# Patient Record
Sex: Male | Born: 1965 | Race: White | Hispanic: No | Marital: Married
Health system: Southern US, Community
[De-identification: ages and names within clinical notes are randomized; demographics above are authoritative.]

## PROBLEM LIST (undated history)

## (undated) DIAGNOSIS — L80 Vitiligo: Secondary | ICD-10-CM

---

## 1997-11-28 ENCOUNTER — Ambulatory Visit (HOSPITAL_COMMUNITY): Admission: RE | Admit: 1997-11-28 | Discharge: 1997-11-28 | Payer: Self-pay | Admitting: Gastroenterology

## 2004-12-04 ENCOUNTER — Encounter: Admission: RE | Admit: 2004-12-04 | Discharge: 2004-12-04 | Payer: Self-pay | Admitting: Family Medicine

## 2006-02-22 IMAGING — US US ABDOMEN COMPLETE
1 series · 14 of 25 positions shown · non-contrast
Comparison: none

CLINICAL DATA: Abnormal LFT?s. 
 ULTRASOUND ABDOMEN COMPLETE:
 Scans over the upper abdomen were performed.  The gallbladder is well seen and no gallstones are noted.  The liver has a normal echogenic pattern.  The common bile duct is normal measuring 5.0 mm in diameter.  IVC, pancreas and spleen appear normal.  No hydronephrosis is noted.  The right kidney measures 11.5 cm sagittally with the left kidney measuring 12.4 cm.  There is an apparent parapelvic cyst on the left of 1.5 cm.  The abdominal aorta is normal in caliber.

[Series 1: unknown · 0.26mm/px · 14 of 64 slices shown]
[im 1/64]
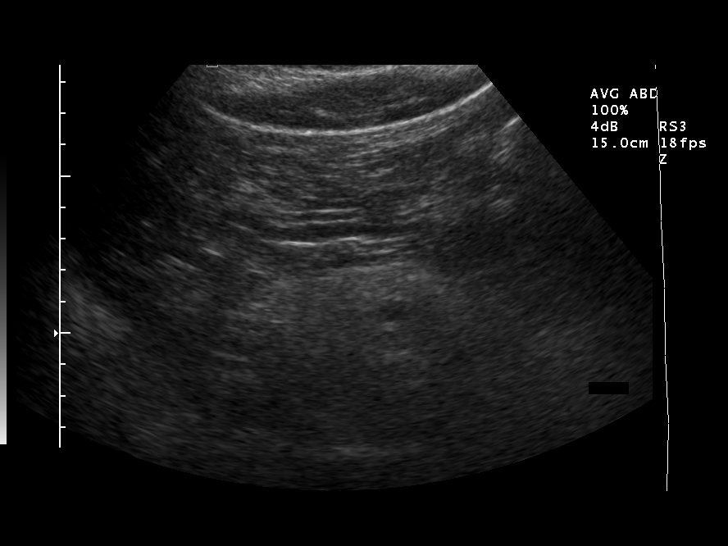
[im 6/64]
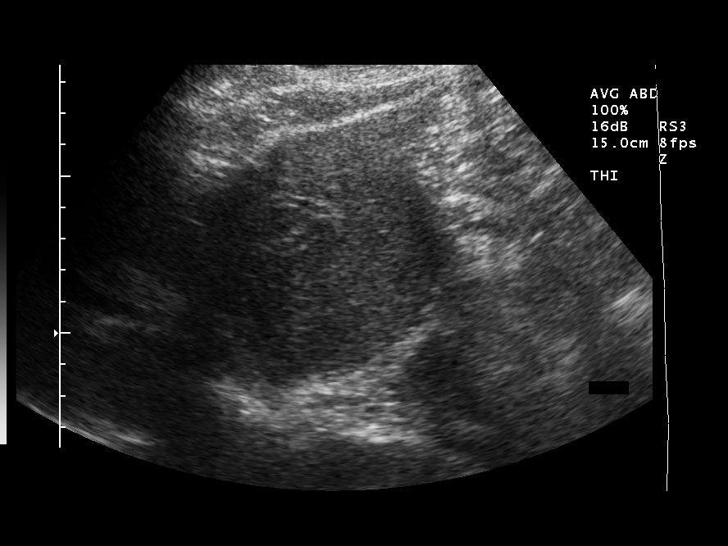
[im 11/64]
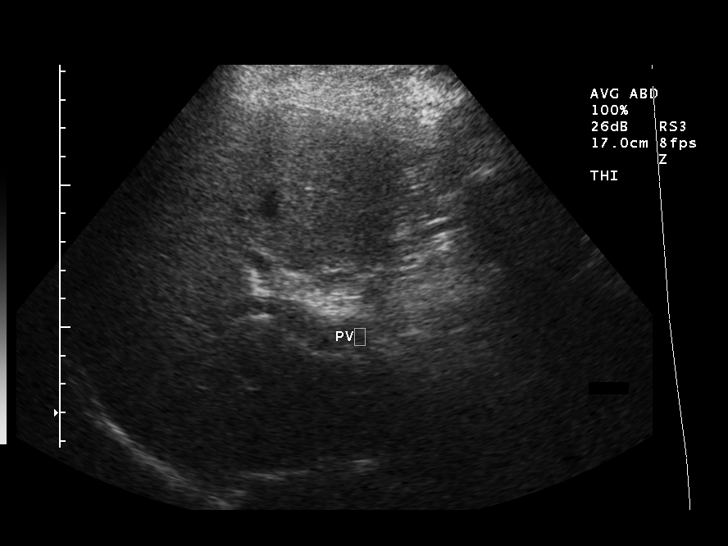
[im 16/64]
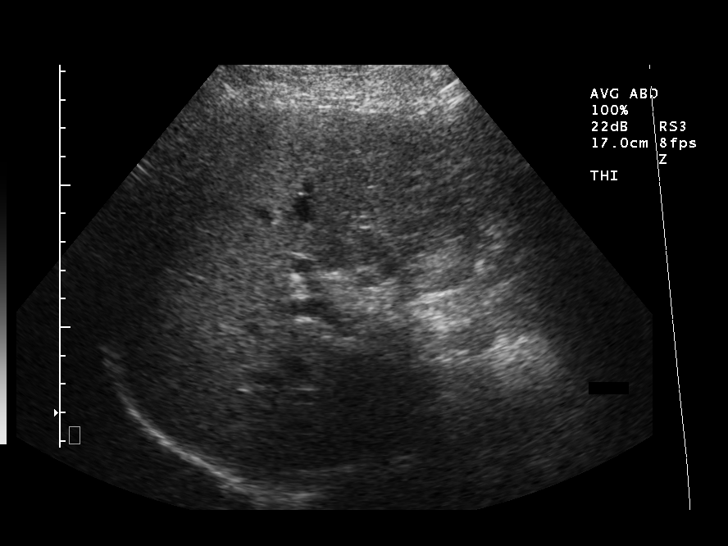
[im 22/64]
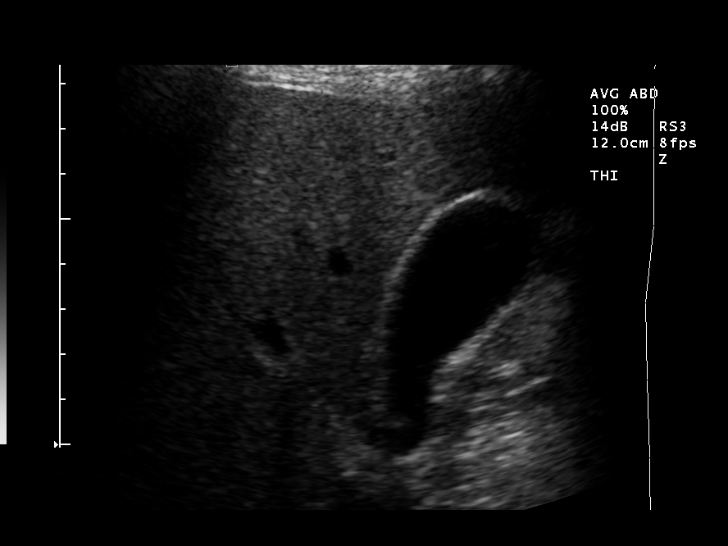
[im 24/64]
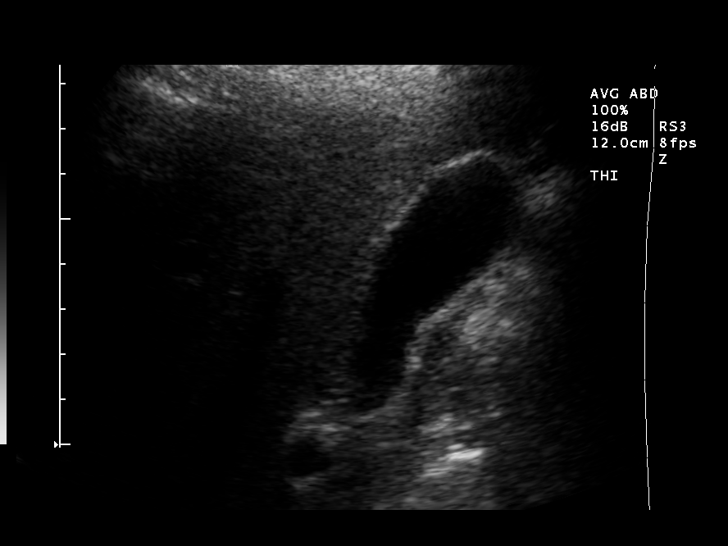
[im 29/64]
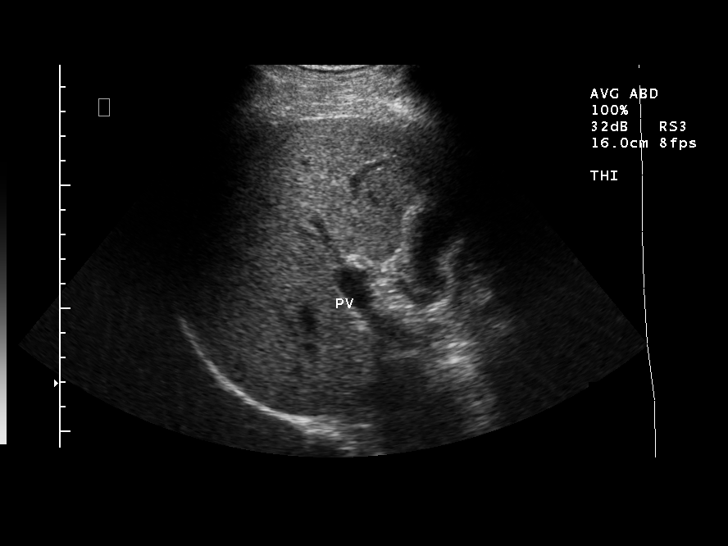
[im 35/64]
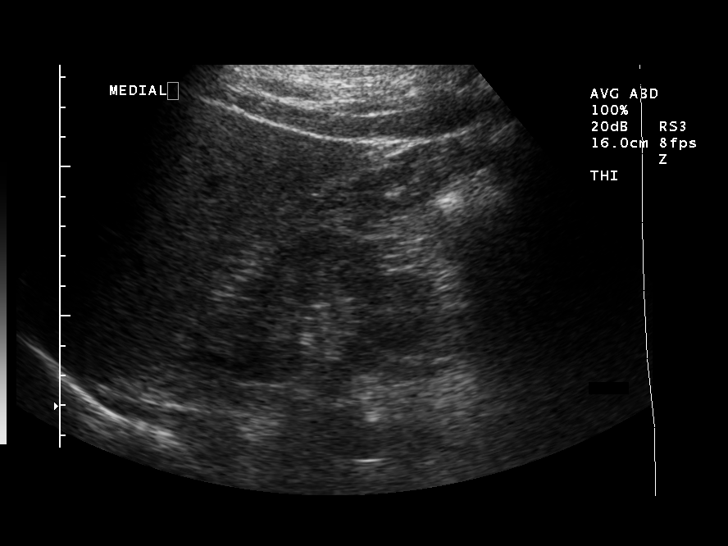
[im 40/64]
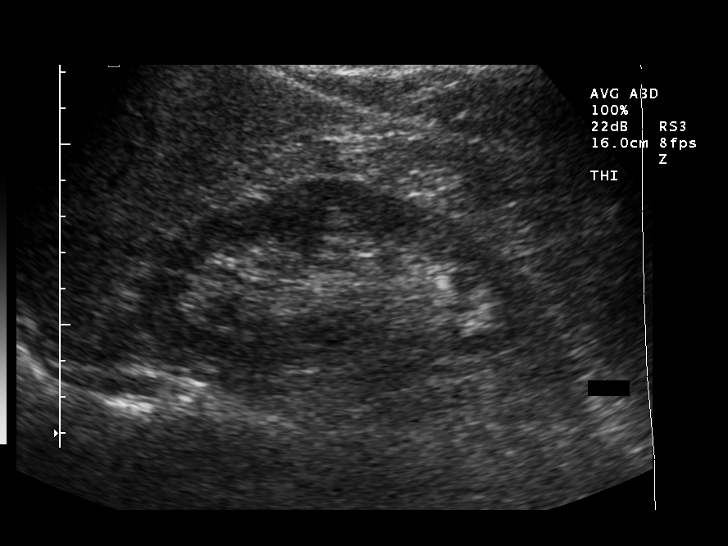
[im 43/64]
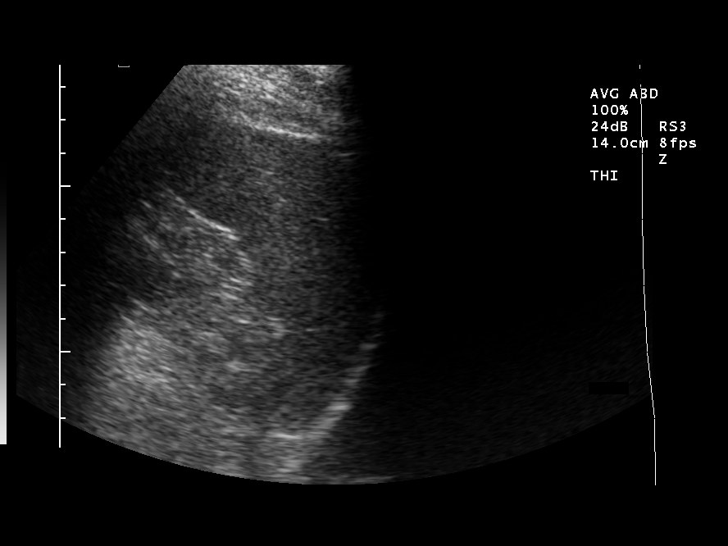
[im 48/64]
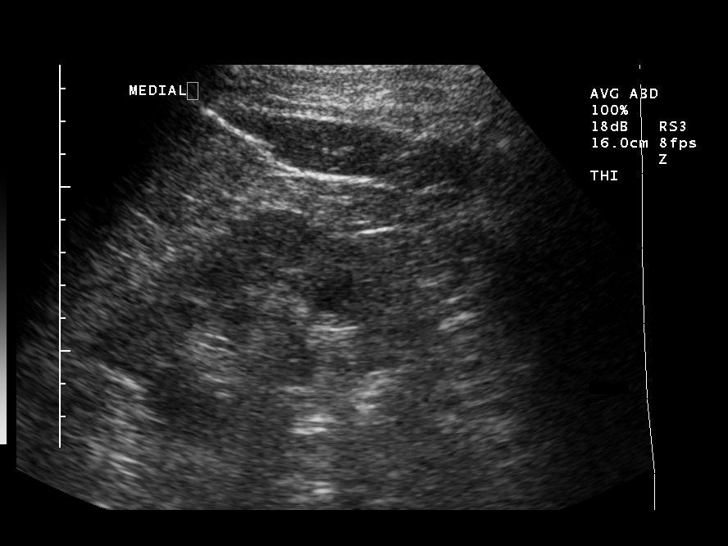
[im 53/64]
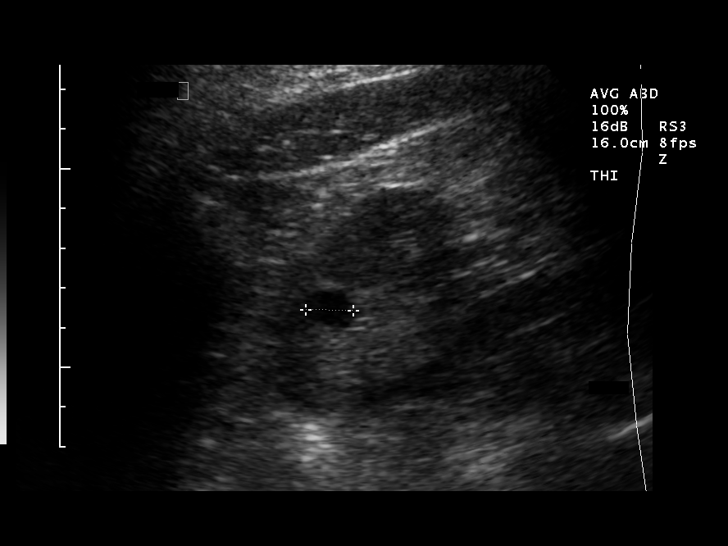
[im 58/64]
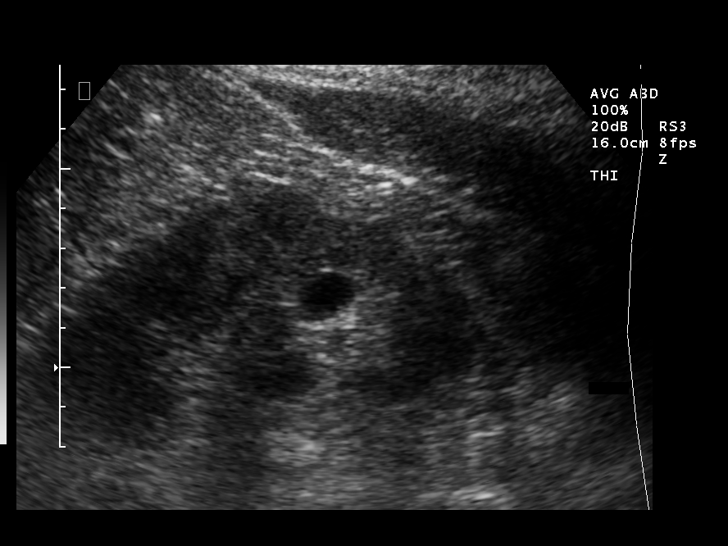
[im 64/64]
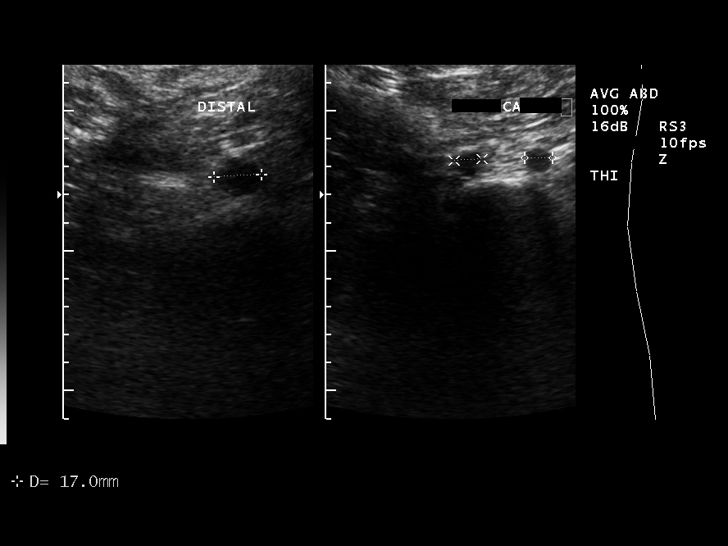

[14 of 25 positions shown; findings below may reference images not displayed]

IMPRESSION: Negative abdominal ultrasound.  No gallstones.

## 2018-05-24 ENCOUNTER — Other Ambulatory Visit: Payer: Self-pay

## 2018-05-24 ENCOUNTER — Encounter (HOSPITAL_BASED_OUTPATIENT_CLINIC_OR_DEPARTMENT_OTHER): Payer: Self-pay

## 2018-05-24 ENCOUNTER — Emergency Department (HOSPITAL_BASED_OUTPATIENT_CLINIC_OR_DEPARTMENT_OTHER): Payer: 59

## 2018-05-24 ENCOUNTER — Emergency Department (HOSPITAL_BASED_OUTPATIENT_CLINIC_OR_DEPARTMENT_OTHER)
Admission: EM | Admit: 2018-05-24 | Discharge: 2018-05-25 | Disposition: A | Discharge status: Home / Self Care | Payer: 59 | Attending: Emergency Medicine | Admitting: Emergency Medicine

## 2018-05-24 DIAGNOSIS — K5100 Ulcerative (chronic) pancolitis without complications: Secondary | ICD-10-CM | POA: Insufficient documentation

## 2018-05-24 DIAGNOSIS — R197 Diarrhea, unspecified: Secondary | ICD-10-CM | POA: Diagnosis present

## 2018-05-24 HISTORY — DX: Vitiligo: L80

## 2018-05-24 LAB — COMPREHENSIVE METABOLIC PANEL
ALT: 55 U/L — ABNORMAL HIGH (ref 0–44)
ANION GAP: 7 (ref 5–15)
AST: 28 U/L (ref 15–41)
Albumin: 4.5 g/dL (ref 3.5–5.0)
Alkaline Phosphatase: 152 U/L — ABNORMAL HIGH (ref 38–126)
BILIRUBIN TOTAL: 0.9 mg/dL (ref 0.3–1.2)
BUN: 12 mg/dL (ref 6–20)
CALCIUM: 9.1 mg/dL (ref 8.9–10.3)
CO2: 23 mmol/L (ref 22–32)
Chloride: 105 mmol/L (ref 98–111)
Creatinine, Ser: 1.1 mg/dL (ref 0.61–1.24)
Glucose, Bld: 102 mg/dL — ABNORMAL HIGH (ref 70–99)
POTASSIUM: 3.9 mmol/L (ref 3.5–5.1)
Sodium: 135 mmol/L (ref 135–145)
TOTAL PROTEIN: 7.6 g/dL (ref 6.5–8.1)

## 2018-05-24 LAB — CBC
HCT: 50.8 % (ref 39.0–52.0)
HEMOGLOBIN: 17.1 g/dL — AB (ref 13.0–17.0)
MCH: 31.7 pg (ref 26.0–34.0)
MCHC: 33.7 g/dL (ref 30.0–36.0)
MCV: 94.1 fL (ref 80.0–100.0)
NRBC: 0 % (ref 0.0–0.2)
PLATELETS: 262 10*3/uL (ref 150–400)
RBC: 5.4 MIL/uL (ref 4.22–5.81)
RDW: 12.3 % (ref 11.5–15.5)
WBC: 15.4 10*3/uL — AB (ref 4.0–10.5)

## 2018-05-24 LAB — LIPASE, BLOOD: Lipase: 39 U/L (ref 11–51)

## 2018-05-24 MED ORDER — IOHEXOL 300 MG/ML  SOLN
100.0000 mL | Freq: Once | INTRAMUSCULAR | Status: AC | PRN
  Administered 2018-05-25: 100 mL via INTRAVENOUS

## 2018-05-24 NOTE — ED Provider Notes (Signed)
[facility] EMERGENCY DEPARTMENT Provider Note   CSN: [id number] Arrival date & time: 05/24/18  1823    History   Chief Complaint Chief Complaint  Patient presents with  . Abdominal Pain    HPI Douglas Cisneros is a 53 y.o. male.     HPI Patient presents to the emergency department with diarrhea after starting clindamycin.  The patient states he started taking clindamycin for dental issues and then on the second day developed diarrhea that continued for the next week.  Patient states that nothing seems to make the condition better or worse.  The patient denies chest pain, shortness of breath, headache,blurred vision, neck pain, fever, cough, weakness, numbness, dizziness, anorexia, edema, vomiting,  rash, back pain, dysuria, hematemesis, bloody stool, near syncope, or syncope. Past Medical History:  Diagnosis Date  . Vitiligo     There are no active problems to display for this patient.   History reviewed. No pertinent surgical history.      Home Medications    Prior to Admission medications   Not on File    Family History No family history on file.  Social History Social History   Tobacco Use  . Smoking status: Not on file  Substance Use Topics  . Alcohol use: Not on file  . Drug use: Not on file     Allergies   Azithromycin; Penicillins; and Sulfa antibiotics   Review of Systems Review of Systems All other systems negative except as documented in the HPI. All pertinent positives and negatives as reviewed in the HPI.  Physical Exam Updated Vital Signs BP (!) 144/93 (BP Location: Left Arm)   Pulse 73   Temp 98.9 F (37.2 C) (Oral)   Resp 18   Ht 5\' 9"  (1.753 m)   Wt 99.8 kg   SpO2 98%   BMI 32.49 kg/m   Physical Exam Vitals signs and nursing note reviewed.  Constitutional:      General: He is not in acute distress.    Appearance: He is well-developed.  HENT:     Head: Normocephalic and atraumatic.  Eyes:     Pupils: Pupils  are equal, round, and reactive to light.  Neck:     Musculoskeletal: Normal range of motion and neck supple.  Cardiovascular:     Rate and Rhythm: Normal rate and regular rhythm.     Heart sounds: Normal heart sounds. No murmur. No friction rub. No gallop.   Pulmonary:     Effort: Pulmonary effort is normal. No respiratory distress.     Breath sounds: Normal breath sounds. No wheezing.  Abdominal:     General: Bowel sounds are normal. There is no distension.     Palpations: Abdomen is soft.     Tenderness: There is no abdominal tenderness.  Skin:    General: Skin is warm and dry.     Capillary Refill: Capillary refill takes less than 2 seconds.     Findings: No erythema or rash.  Neurological:     Mental Status: He is alert and oriented to person, place, and time.     Motor: No abnormal muscle tone.     Coordination: Coordination normal.  Psychiatric:        Behavior: Behavior normal.      ED Treatments / Results  Labs (all labs ordered are listed, but only abnormal results are displayed) Labs Reviewed  COMPREHENSIVE METABOLIC PANEL - Abnormal; Notable for the following components:      Result Value  Glucose, Bld 102 (*)    ALT 55 (*)    Alkaline Phosphatase 152 (*)    All other components within normal limits  CBC - Abnormal; Notable for the following components:   WBC 15.4 (*)    Hemoglobin 17.1 (*)    All other components within normal limits  LIPASE, BLOOD  URINALYSIS, ROUTINE W REFLEX MICROSCOPIC    EKG None  Radiology No results found.  Procedures Procedures (including critical care time)  Medications Ordered in ED Medications - No data to display   Initial Impression / Assessment and Plan / ED Course  I have reviewed the triage vital signs and the nursing notes.  Pertinent labs & imaging results that were available during my care of the patient were reviewed by me and considered in my medical decision making (see chart for details).         Patient will have CT scan to further evaluate his abdominal discomfort and feelings of distention.  Patient is advised of the fluid thus far and all questions were answered.  Final Clinical Impressions(s) / ED Diagnoses   Final diagnoses:  None    ED Discharge Orders    None       Charlestine Night, PA-C 05/24/18 2332    Alvira Monday, MD 05/26/18 1510

## 2018-05-24 NOTE — ED Triage Notes (Signed)
Pt c/o generalized abdominal cramping and diarrhea since Monday, sent by [facility] for blood work, recently was on clindamycin, states everything goes through him

## 2018-05-25 LAB — GASTROINTESTINAL PANEL BY PCR, STOOL (REPLACES STOOL CULTURE)

## 2018-05-25 LAB — C DIFFICILE QUICK SCREEN W PCR REFLEX
C Diff antigen: NEGATIVE
C Diff interpretation: NOT DETECTED
C Diff toxin: NEGATIVE

## 2018-05-25 MED ORDER — VANCOMYCIN HCL 125 MG PO CAPS: 125.0000 mg | ORAL_CAPSULE | Freq: Four times a day (QID) | ORAL | 0 refills | Status: AC

## 2018-05-25 NOTE — ED Notes (Signed)
PT states understanding of care given, follow up care, and medication prescribed. PT ambulated from ED to car with a steady gait. 

## 2018-05-25 NOTE — ED Provider Notes (Addendum)
Nursing notes and vitals signs, including pulse oximetry, reviewed.  Summary of this visit's results, reviewed by myself:  EKG:  EKG Interpretation  Date/Time:    Ventricular Rate:    PR Interval:    QRS Duration:   QT Interval:    QTC Calculation:   R Axis:     Text Interpretation:         Labs:  Results for orders placed or performed during the hospital encounter of 05/24/18 (from the past 24 hour(s))  Lipase, blood     Status: None   Collection Time: 05/24/18 10:06 PM  Result Value Ref Range   Lipase 39 11 - 51 U/L  Comprehensive metabolic panel     Status: Abnormal   Collection Time: 05/24/18 10:06 PM  Result Value Ref Range   Sodium 135 135 - 145 mmol/L   Potassium 3.9 3.5 - 5.1 mmol/L   Chloride 105 98 - 111 mmol/L   CO2 23 22 - 32 mmol/L   Glucose, Bld 102 (H) 70 - 99 mg/dL   BUN 12 6 - 20 mg/dL   Creatinine, Ser 5.40 0.61 - 1.24 mg/dL   Calcium 9.1 8.9 - 08.6 mg/dL   Total Protein 7.6 6.5 - 8.1 g/dL   Albumin 4.5 3.5 - 5.0 g/dL   AST 28 15 - 41 U/L   ALT 55 (H) 0 - 44 U/L   Alkaline Phosphatase 152 (H) 38 - 126 U/L   Total Bilirubin 0.9 0.3 - 1.2 mg/dL   GFR calc non Af Amer >60 >60 mL/min   GFR calc Af Amer >60 >60 mL/min   Anion gap 7 5 - 15  CBC     Status: Abnormal   Collection Time: 05/24/18 10:06 PM  Result Value Ref Range   WBC 15.4 (H) 4.0 - 10.5 K/uL   RBC 5.40 4.22 - 5.81 MIL/uL   Hemoglobin 17.1 (H) 13.0 - 17.0 g/dL   HCT 76.1 95.0 - 93.2 %   MCV 94.1 80.0 - 100.0 fL   MCH 31.7 26.0 - 34.0 pg   MCHC 33.7 30.0 - 36.0 g/dL   RDW 67.1 24.5 - 80.9 %   Platelets 262 150 - 400 K/uL   nRBC 0.0 0.0 - 0.2 %    Imaging Studies: Ct Abdomen Pelvis W Contrast  Result Date: 05/25/2018 CLINICAL DATA:  Acute abdominal pain. Patient reports diarrhea for 4 days. EXAM: CT ABDOMEN AND PELVIS WITH CONTRAST TECHNIQUE: Multidetector CT imaging of the abdomen and pelvis was performed using the standard protocol following bolus administration of intravenous  contrast. CONTRAST:  OMNIPAQUE IOHEXOL 300 MG/ML  SOLN COMPARISON:  None. FINDINGS: Lower chest: The lung bases are clear. Hepatobiliary: 13 mm cyst in the upper left lobe of the liver. No suspicious hepatic abnormalities. Gallbladder physiologically distended, no calcified stone. No biliary dilatation. Pancreas: No ductal dilatation or inflammation. Spleen: Normal in size without focal abnormality. Adrenals/Urinary Tract: Normal adrenal glands. No hydronephrosis. Bilateral nonobstructing nephrolithiasis. Subcentimeter cortical low densities in both kidneys are too small to characterize but likely small cysts. Mild cortical scarring in the upper left kidney. Bladder partially distended and unremarkable. Stomach/Bowel: Pan colonic wall thickening with mild pericolonic edema, most prominent involving the ascending colon. No perforation or abscess. Normal appendix. Small bowel is unremarkable. Tiny hiatal hernia, no gastric wall thickening. Vascular/Lymphatic: Normal caliber abdominal aorta. Portal vein and mesenteric vessels are patent. No enlarged lymph nodes in the abdomen or pelvis. Reproductive: Enlarged prostate gland spanning 5.3 cm. Other: No  free air, ascites, or intra-abdominal fluid collection. Tiny fat containing umbilical hernia. Musculoskeletal: There are no acute or suspicious osseous abnormalities. IMPRESSION: 1. Pancolitis, most prominent involving the ascending colon. This may be infectious or inflammatory. 2. Bilateral nonobstructing nephrolithiasis. 3. Prominent prostate gland. Electronically Signed   By: Narda Rutherford M.D.   On: 05/25/2018 01:19   Will start on oral vancomycin for likely C. difficile pancolitis given recent course of clindamycin.  Stool analysis pending.  Patient's abdomen is soft, nontender, nondistended.       Joanthan Hlavacek, Jonny Ruiz, MD 05/25/18 [id number]    Paula Libra, MD 05/25/18 [phone]

## 2019-08-13 IMAGING — CT CT ABD-PELV W/ CM
2 of 5 series · 16 of 46 positions shown, 18 images · IV contrast (APPLIED)
Comparison: None.

CLINICAL DATA: Acute abdominal pain. Patient reports diarrhea for 4
days.

EXAM:
CT ABDOMEN AND PELVIS WITH CONTRAST
TECHNIQUE: Multidetector CT imaging of the abdomen and pelvis was performed
using the standard protocol following bolus administration of
intravenous contrast.
CONTRAST:  100mL OMNIPAQUE IOHEXOL 300 MG/ML  SOLN

[Series 2: axial st · axial · 0.88mm/px · z∈[-507,-2]mm · 13 of 113 slices shown, 15 images]
[im 6/113  soft-tissue]
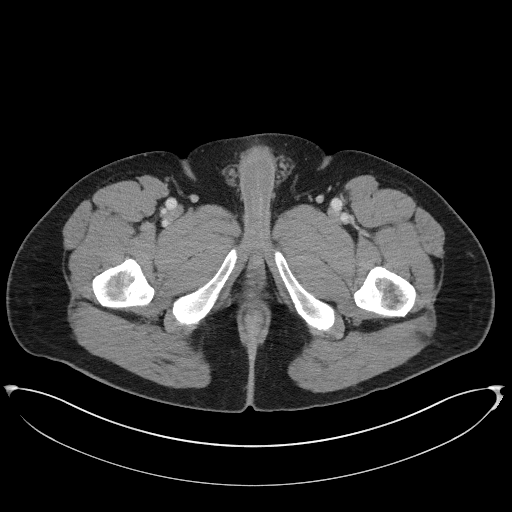
[im 6/113  bone]
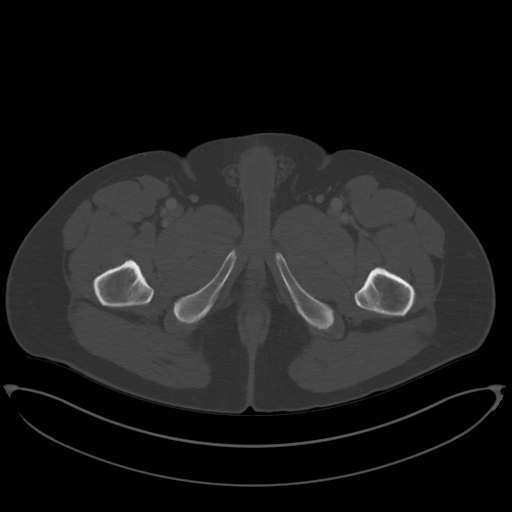
[im 18/113  soft-tissue]
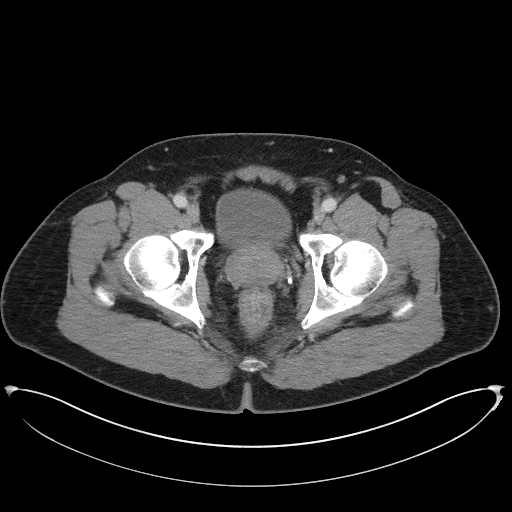
[im 24/113  soft-tissue]
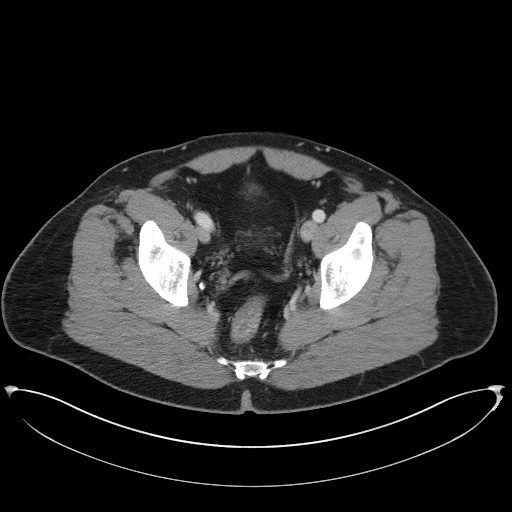
[im 30/113  soft-tissue]
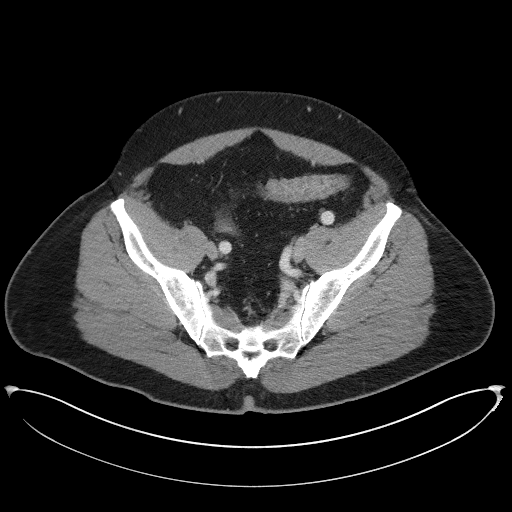
[im 42/113  soft-tissue]
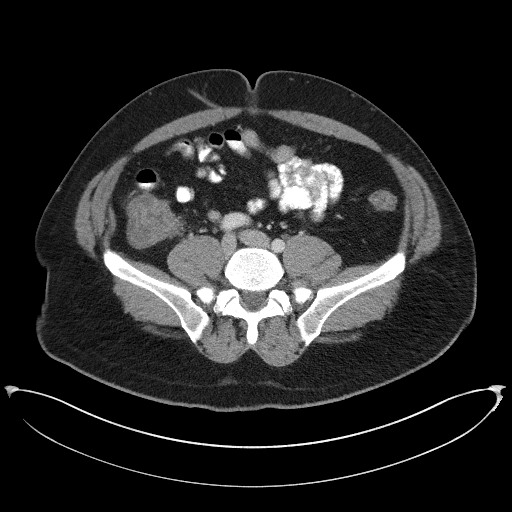
[im 48/113  soft-tissue]
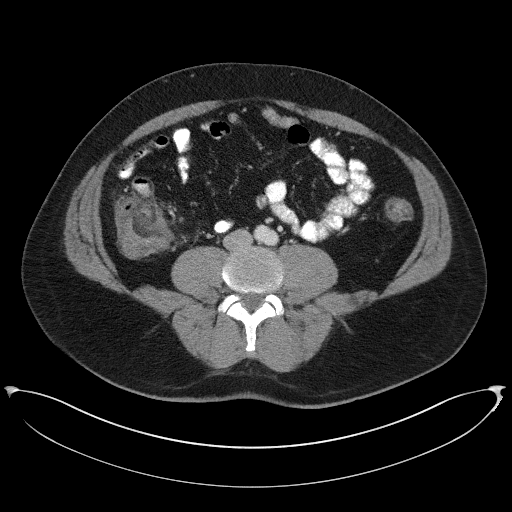
[im 59/113  soft-tissue]
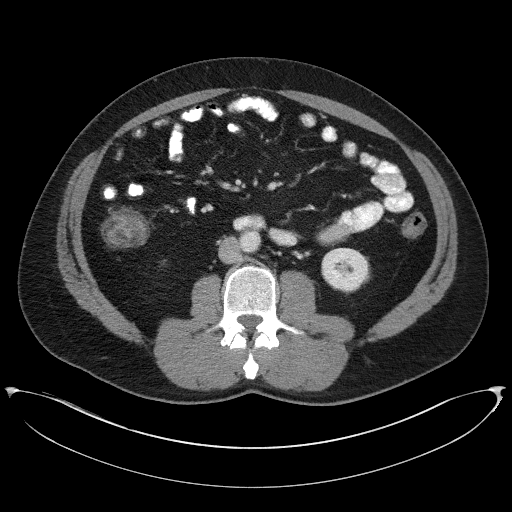
[im 65/113  soft-tissue]
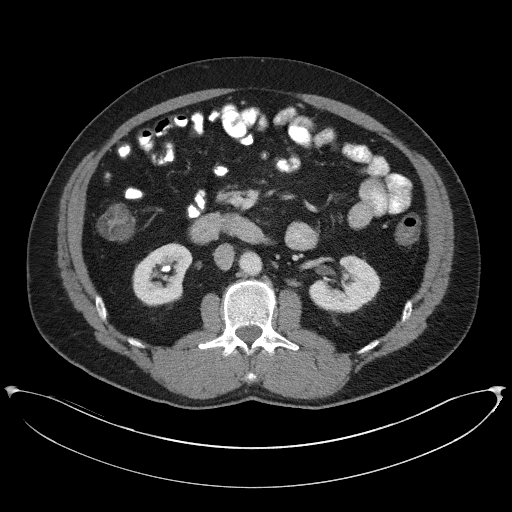
[im 71/113  soft-tissue]
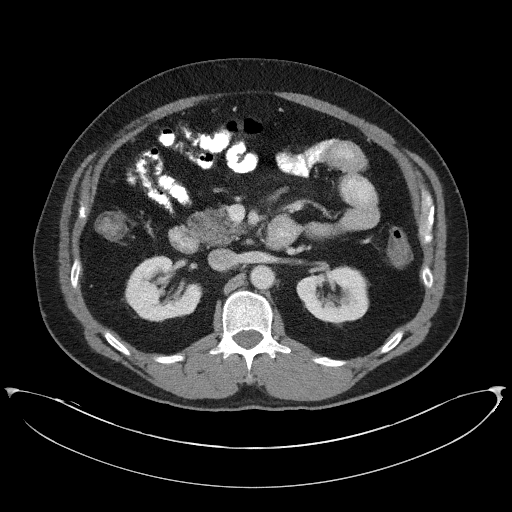
[im 71/113  bone]
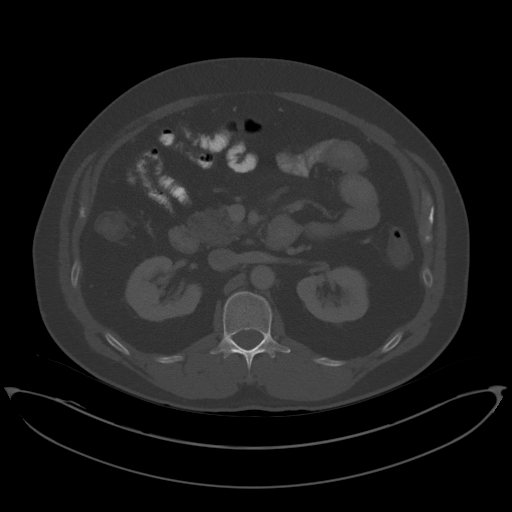
[im 83/113  soft-tissue]
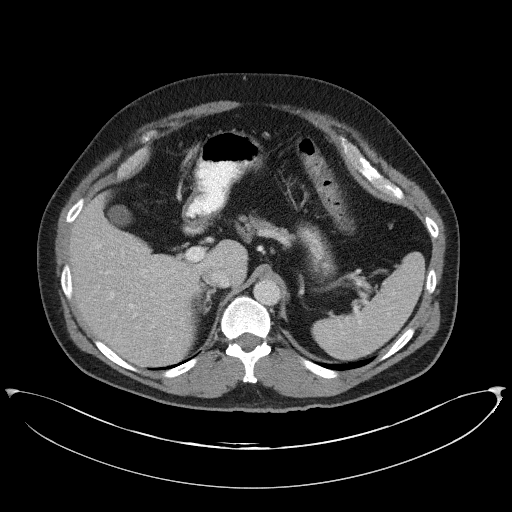
[im 89/113  soft-tissue]
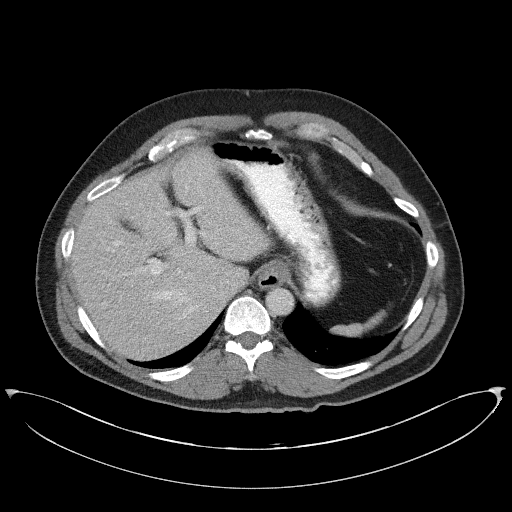
[im 95/113  soft-tissue]
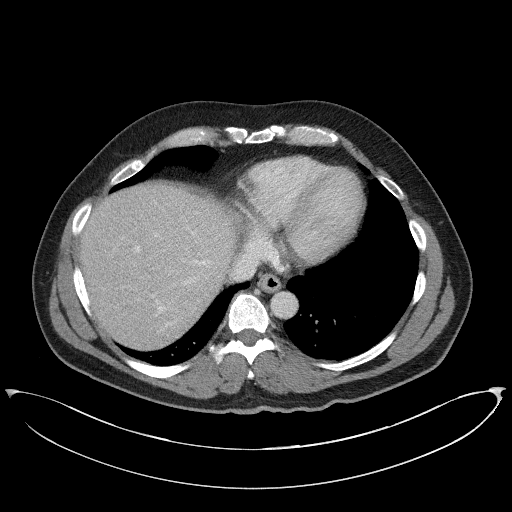
[im 107/113  soft-tissue]
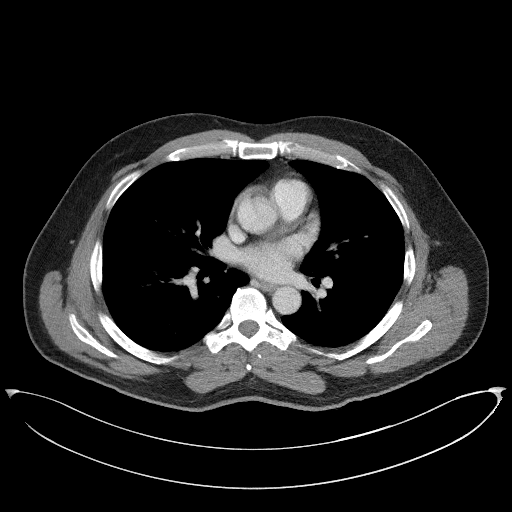

[Series 5: coronal st · coronal · 0.94mm/px · 3 of 101 slices shown]
[im 34/101  soft-tissue]
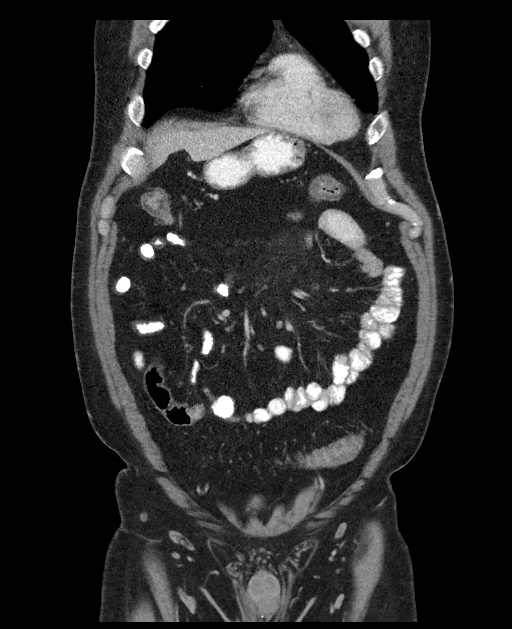
[im 45/101  soft-tissue]
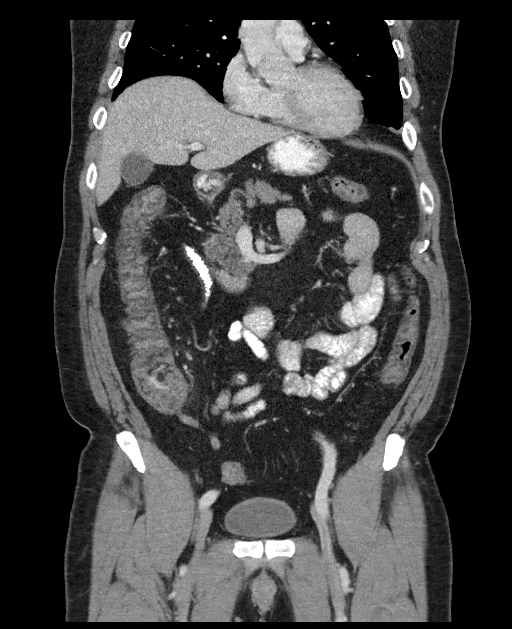
[im 56/101  soft-tissue]
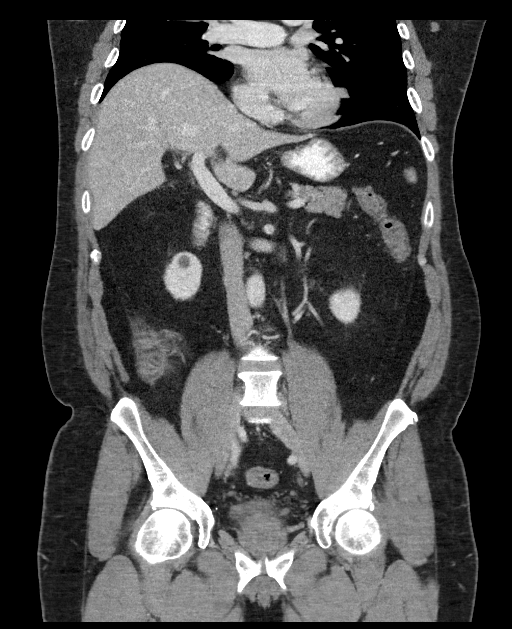

[16 of 46 positions shown; findings below may reference images not displayed]

FINDINGS: Lower chest: The lung bases are clear.

Hepatobiliary: 13 mm cyst in the upper left lobe of the liver. No
suspicious hepatic abnormalities. Gallbladder physiologically
distended, no calcified stone. No biliary dilatation.

Pancreas: No ductal dilatation or inflammation.

Spleen: Normal in size without focal abnormality.

Adrenals/Urinary Tract: Normal adrenal glands. No hydronephrosis.
Bilateral nonobstructing nephrolithiasis. Subcentimeter cortical low
densities in both kidneys are too small to characterize but likely
small cysts. Mild cortical scarring in the upper left kidney.
Bladder partially distended and unremarkable.

Stomach/Bowel: Pan colonic wall thickening with mild pericolonic
edema, most prominent involving the ascending colon. No perforation
or abscess. Normal appendix. Small bowel is unremarkable. Tiny
hiatal hernia, no gastric wall thickening.

Vascular/Lymphatic: Normal caliber abdominal aorta. Portal vein and
mesenteric vessels are patent. No enlarged lymph nodes in the
abdomen or pelvis.

Reproductive: Enlarged prostate gland spanning 5.3 cm.

Other: No free air, ascites, or intra-abdominal fluid collection.
Tiny fat containing umbilical hernia.

Musculoskeletal: There are no acute or suspicious osseous
abnormalities.
IMPRESSION: 1. Pancolitis, most prominent involving the ascending colon. This
may be infectious or inflammatory.
2. Bilateral nonobstructing nephrolithiasis.
3. Prominent prostate gland.
